# Patient Record
Sex: Female | Born: 1995 | Race: Black or African American | Hispanic: No | Marital: Single | Smoking: Current some day smoker
Health system: Southern US, Community
[De-identification: ages and names within clinical notes are randomized; demographics above are authoritative.]

## PROBLEM LIST (undated history)

## (undated) DIAGNOSIS — R112 Nausea with vomiting, unspecified: Secondary | ICD-10-CM

## (undated) MED ORDER — METRONIDAZOLE 500 MG TAB
500 mg | ORAL_TABLET | Freq: Two times a day (BID) | ORAL | Status: AC
Start: ? — End: 2013-11-10

---
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
## 2014-09-25 ENCOUNTER — Inpatient Hospital Stay
Admit: 2014-09-25 | Discharge: 2014-09-25 | Disposition: A | Discharge status: Home / Self Care | Payer: Self-pay | Attending: Emergency Medicine

## 2014-09-25 DIAGNOSIS — K2900 Acute gastritis without bleeding: Secondary | ICD-10-CM

## 2014-09-25 LAB — LIPASE: Lipase: 156 U/L (ref 73–393)

## 2014-09-25 LAB — METABOLIC PANEL, COMPREHENSIVE
A-G Ratio: 1.1 — ABNORMAL LOW (ref 1.2–3.5)
ALT (SGPT): 23 U/L (ref 12–65)
AST (SGOT): 21 U/L (ref 15–37)
Albumin: 4.3 g/dL (ref 3.5–5.0)
Alk. phosphatase: 64 U/L (ref 50–136)
Anion gap: 7 mmol/L (ref 7–16)
BUN: 12 MG/DL (ref 6–23)
Bilirubin, total: 0.3 MG/DL (ref 0.2–1.1)
CO2: 28 mmol/L (ref 21–32)
Calcium: 8.8 MG/DL (ref 8.3–10.4)
Chloride: 104 mmol/L (ref 98–107)
Creatinine: 0.67 MG/DL (ref 0.6–1.0)
GFR est AA: >60 mL/min/{1.73_m2} (ref >60)
GFR est non-AA: >60 mL/min/{1.73_m2} (ref >60)
Globulin: 3.9 g/dL — ABNORMAL HIGH (ref 2.3–3.5)
Glucose: 82 mg/dL (ref 65–100)
Potassium: 3.8 mmol/L (ref 3.5–5.1)
Protein, total: 8.2 g/dL (ref 6.3–8.2)
Sodium: 139 mmol/L (ref 136–145)

## 2014-09-25 LAB — CBC WITH AUTOMATED DIFF
ABS. BASOPHILS: 0 10*3/uL (ref 0.0–0.2)
ABS. EOSINOPHILS: 0.1 10*3/uL (ref 0.0–0.8)
ABS. IMM. GRANS.: 0 10*3/uL (ref 0.0–0.5)
ABS. LYMPHOCYTES: 2.6 10*3/uL (ref 0.5–4.6)
ABS. MONOCYTES: 0.4 10*3/uL (ref 0.1–1.3)
ABS. NEUTROPHILS: 4.7 10*3/uL (ref 1.7–8.2)
BASOPHILS: 0 % (ref 0.0–2.0)
EOSINOPHILS: 1 % (ref 0.5–7.8)
HCT: 40.3 % (ref 35.8–46.3)
HGB: 13.7 g/dL (ref 11.7–15.4)
IMMATURE GRANULOCYTES: 0.1 % (ref 0.0–5.0)
LYMPHOCYTES: 33 % (ref 13–44)
MCH: 30.6 PG (ref 26.1–32.9)
MCHC: 34 g/dL (ref 31.4–35.0)
MCV: 90 FL (ref 79.6–97.8)
MONOCYTES: 5 % (ref 4.0–12.0)
MPV: 11.9 FL (ref 10.8–14.1)
NEUTROPHILS: 61 % (ref 43–78)
PLATELET: 219 10*3/uL (ref 150–450)
RBC: 4.48 M/uL (ref 4.05–5.25)
RDW: 12.6 % (ref 11.9–14.6)
WBC: 7.8 10*3/uL (ref 4.5–13.5)

## 2014-09-25 LAB — HCG URINE, QL. - POC: Pregnancy test,urine (POC): NEGATIVE

## 2014-09-25 LAB — URINE MICROSCOPIC
Bacteria: 0 /hpf
Casts: 0 /lpf
RBC: >100 /hpf — ABNORMAL HIGH

## 2014-09-25 MED ORDER — GI COCKTAIL
Freq: Once | ORAL | Status: AC
Start: 2014-09-25 — End: 2014-09-25
  Administered 2014-09-25: 21:00:00 via ORAL

## 2014-09-25 MED ORDER — LIDOCAINE 2 % MUCOSAL SOLN
2 % | Freq: Once | Status: AC
Start: 2014-09-25 — End: 2014-09-25
  Administered 2014-09-25: 21:00:00 via OROMUCOSAL

## 2014-09-25 MED ORDER — ONDANSETRON (PF) 4 MG/2 ML INJECTION
4 mg/2 mL | INTRAMUSCULAR | Status: AC
Start: 2014-09-25 — End: 2014-09-25
  Administered 2014-09-25: 21:00:00 via INTRAVENOUS

## 2014-09-25 MED ORDER — FAMOTIDINE 40 MG TAB
40 mg | ORAL_TABLET | Freq: Every evening | ORAL | Status: AC
Start: 2014-09-25 — End: ?

## 2014-09-25 MED FILL — ONDANSETRON (PF) 4 MG/2 ML INJECTION: 4 mg/2 mL | INTRAMUSCULAR | Qty: 2

## 2014-09-25 MED FILL — LIDOCAINE 2 % MUCOSAL SOLN: 2 % | Qty: 15

## 2014-09-25 MED FILL — GI COCKTAIL: ORAL | Qty: 30

## 2014-09-25 NOTE — ED Provider Notes (Addendum)
HPI Comments: Patient reports upper abdominal pain for the past week.  She denies any change with food.  No ill contacts or travel.  She has had diarrhea for the same time with nausea.  No episodes of vomiting or fever.  She reports having a normal menstruation last week.  She developed vaginal bleeding again 2 days ago.  She is having to change her pad about every 2 hours.  She does admit to having history of irregular vaginal bleeding in the past.  Denies any pain with urination.    Patient is a 18 y.o. female presenting with abdominal pain and vaginal bleeding. The history is provided by the patient.   Abdominal Pain   This is a new problem. Episode onset: one week. The problem occurs constantly. The problem has not changed since onset.The pain is located in the epigastric region. The quality of the pain is aching. The pain is moderate. Associated symptoms include diarrhea and nausea. Pertinent negatives include no fever, no vomiting, no dysuria, no headaches, no trauma, no chest pain and no back pain. Nothing worsens the pain. The pain is relieved by nothing.   Vaginal Bleeding  This is a new problem. The current episode started 2 days ago. The problem occurs constantly. The problem has not changed since onset.Associated symptoms include abdominal pain. Pertinent negatives include no chest pain, no headaches and no shortness of breath. Nothing aggravates the symptoms. Nothing relieves the symptoms.        Past Medical History:   Diagnosis Date   ??? Asthma        History reviewed. No pertinent past surgical history.      History reviewed. No pertinent family history.    History     Social History   ??? Marital Status: SINGLE     Spouse Name: N/A     Number of Children: N/A   ??? Years of Education: N/A     Occupational History   ??? Not on file.     Social History Main Topics   ??? Smoking status: Never Smoker    ??? Smokeless tobacco: Not on file   ??? Alcohol Use: No   ??? Drug Use: No   ??? Sexual Activity: No      Other Topics Concern   ??? Not on file     Social History Narrative                ALLERGIES: Review of patient's allergies indicates no known allergies.      Review of Systems   Constitutional: Negative for fever and chills.   HENT: Negative for hearing loss.    Eyes: Negative for visual disturbance.   Respiratory: Negative for cough and shortness of breath.    Cardiovascular: Negative for chest pain and palpitations.   Gastrointestinal: Positive for nausea, abdominal pain and diarrhea. Negative for vomiting and blood in stool.   Genitourinary: Positive for vaginal bleeding. Negative for dysuria.   Musculoskeletal: Negative for back pain.   Skin: Negative for rash.   Neurological: Negative for weakness and headaches.   Psychiatric/Behavioral: Negative for confusion.       Filed Vitals:    09/25/14 1422   BP: 107/76   Pulse: 66   Temp: 98.4 ??F (36.9 ??C)   Resp: 16   Height: 5' 0.5" (1.537 m)   Weight: 65.772 kg (145 lb)   SpO2: 100%            Physical Exam   Constitutional: She  appears well-developed and well-nourished.   HENT:   Head: Normocephalic and atraumatic.   Right Ear: External ear normal.   Left Ear: External ear normal.   Nose: Nose normal.   Mouth/Throat: Oropharynx is clear and moist.   Eyes: Conjunctivae are normal. Pupils are equal, round, and reactive to light.   Neck: Normal range of motion. Neck supple.   Cardiovascular: Regular rhythm, normal heart sounds and intact distal pulses.    Pulmonary/Chest: Effort normal and breath sounds normal. No respiratory distress. She has no wheezes.   Abdominal: Soft. Bowel sounds are normal. She exhibits no distension. There is tenderness in the epigastric area. There is no rigidity, no rebound, no guarding, no tenderness at McBurney's point and negative Murphy's sign.       Musculoskeletal: Normal range of motion. She exhibits no edema.   Neurological: She is alert.   Skin: Skin is warm and dry.   Psychiatric: Judgment normal.    Nursing note and vitals reviewed.       MDM  Number of Diagnoses or Management Options     Amount and/or Complexity of Data Reviewed  Clinical lab tests: ordered and reviewed  Tests in the medicine section of CPT??: ordered and reviewed    Risk of Complications, Morbidity, and/or Mortality  Presenting problems: moderate  Diagnostic procedures: moderate  Management options: moderate    Patient Progress  Patient progress: stable      Procedures                                                    4:06 PM  Patient feeling much better.  Ready to go home.  Likely peptic ulcer disease or gastritis.  Patient seems to avoid any spicy foods and will try Pepcid.

## 2014-09-25 NOTE — ED Notes (Signed)
Patient presents with vaginal bleeding and lower abdomen pain.

## 2014-09-25 NOTE — ED Notes (Signed)
I have reviewed medications, follow up provider options, and discharge instructions with the patient and family. The patient verbalized understanding. Copy of discharge information given to patient upon discharge. Prescription(s) given to patient. Patient discharged in no distress.

## 2014-11-11 ENCOUNTER — Inpatient Hospital Stay
Admit: 2014-11-11 | Discharge: 2014-11-11 | Disposition: A | Discharge status: Home / Self Care | Payer: Self-pay | Attending: Emergency Medicine

## 2014-11-11 ENCOUNTER — Emergency Department: Admit: 2014-11-11 | Payer: Self-pay

## 2014-11-11 DIAGNOSIS — R51 Headache: Secondary | ICD-10-CM

## 2014-11-11 LAB — HCG URINE, QL. - POC: Pregnancy test,urine (POC): NEGATIVE

## 2014-11-11 MED ORDER — NAPROXEN 500 MG TAB
500 mg | ORAL_TABLET | Freq: Two times a day (BID) | ORAL | Status: AC
Start: 2014-11-11 — End: 2014-11-21

## 2014-11-11 MED ORDER — ACETAMINOPHEN 325 MG TABLET
325 mg | ORAL | Status: AC
Start: 2014-11-11 — End: 2014-11-11
  Administered 2014-11-11: 15:00:00 via ORAL

## 2014-11-11 MED FILL — TYLENOL 325 MG TABLET: 325 mg | ORAL | Qty: 2

## 2014-11-11 NOTE — Progress Notes (Signed)
Visited with patient as no PCP listed in chart.  Patient does not have a PCP - states she just signed up for insurance and will be effective with [employer] in March.  I have provided patient with a list of PCPs and offered to make her an appointment.  Encouraged to call now make appointment to get established for March if she chooses to do it herself.

## 2014-11-11 NOTE — ED Provider Notes (Signed)
HPI Comments: Pt states her biology book fell from shelf and hit her across the nose yesterday, headache since, feels like "fluid is in my head, something is wrong", no meds taken for pain, no nv    Patient is a 19 y.o. female presenting with facial pain. The history is provided by the patient.   Facial Pain   The incident occurred 12 to 24 hours ago. She came to the ER via walk-in. The injury mechanism was a direct blow. The volume of blood lost was none. The quality of the pain is described as dull. The pain is at a severity of 10/10. The pain is mild. The pain has been constant since the injury. Pertinent negatives include no numbness, no blurred vision, no vomiting, no tinnitus, no disorientation, no weakness and no memory loss. She has tried nothing for the symptoms. The treatment provided no relief. There was no loss of consciousness. She has been behaving normally.        Past Medical History:   Diagnosis Date   ??? Asthma    ??? Gastrointestinal disorder        Past Surgical History:   Procedure Laterality Date   ??? Hx heent       wisdom teeth          History reviewed. No pertinent family history.    History     Social History   ??? Marital Status: SINGLE     Spouse Name: N/A     Number of Children: N/A   ??? Years of Education: N/A     Occupational History   ??? Not on file.     Social History Main Topics   ??? Smoking status: Never Smoker    ??? Smokeless tobacco: Not on file   ??? Alcohol Use: No   ??? Drug Use: No   ??? Sexual Activity: No     Other Topics Concern   ??? Not on file     Social History Narrative           ALLERGIES: Review of patient's allergies indicates no known allergies.      Review of Systems   HENT: Negative for tinnitus.    Eyes: Negative for blurred vision.   Gastrointestinal: Negative for vomiting.   Neurological: Negative for weakness and numbness.   Psychiatric/Behavioral: Negative for memory loss.   All other systems reviewed and are negative.      Filed Vitals:    11/11/14 0908   BP: 103/63    Pulse: 88   Temp: 98.1 ??F (36.7 ??C)   Resp: 16   Height: 5' 0.5" (1.537 m)   Weight: 65.772 kg (145 lb)   SpO2: 99%            Physical Exam   Constitutional: She is oriented to person, place, and time. She appears well-developed and well-nourished. No distress.   HENT:   Head: Normocephalic.   Right Ear: External ear normal.   Left Ear: External ear normal.   Small abrasion to bridge of nose from glasses, no bleeding, no facial swelling   Eyes: Conjunctivae and EOM are normal. Pupils are equal, round, and reactive to light.   Neck: Normal range of motion. Neck supple.   Cardiovascular: Normal rate, regular rhythm and normal heart sounds.    Pulmonary/Chest: Effort normal and breath sounds normal. No respiratory distress. She has no wheezes.   Abdominal: Soft. Bowel sounds are normal. There is no tenderness. There is no rebound and  no guarding.   Musculoskeletal: Normal range of motion. She exhibits no edema.   Neurological: She is alert and oriented to person, place, and time. She has normal reflexes. No cranial nerve deficit. Coordination normal.   Skin: Skin is warm and dry.   Psychiatric: She has a normal mood and affect.   Nursing note and vitals reviewed.       MDM  Number of Diagnoses or Management Options  Diagnosis management comments: Head ct -  Pt given tylenol for pain in er  rx for naprosyn        Amount and/or Complexity of Data Reviewed  Clinical lab tests: ordered and reviewed  Tests in the radiology section of CPT??: ordered and reviewed    Risk of Complications, Morbidity, and/or Mortality  Presenting problems: low  Diagnostic procedures: low  Management options: low    Patient Progress  Patient progress: improved      Procedures

## 2014-11-11 NOTE — ED Notes (Signed)
I have reviewed medications, follow up provider options, and discharge instructions with the patient and her aunt. The patient verbalized understanding. Copy of discharge information given to patient upon discharge. Prescription(s) given to patient. Patient discharged in no distress.

## 2014-11-11 NOTE — ED Notes (Addendum)
Patient states she hit her face with a box of school books as she was taking it off a shelf approx 12 hours ago.  Patient denies it braking her eyeglasses. Patient denies loc.  Patient reports nasal congestion and frontal headache.

## 2014-12-21 ENCOUNTER — Encounter

## 2015-01-03 ENCOUNTER — Inpatient Hospital Stay: Admit: 2015-01-03 | Payer: BLUE CROSS/BLUE SHIELD | Attending: Emergency Medicine

## 2015-01-03 DIAGNOSIS — R109 Unspecified abdominal pain: Secondary | ICD-10-CM

## 2015-01-03 DIAGNOSIS — R112 Nausea with vomiting, unspecified: Secondary | ICD-10-CM

## 2015-01-03 MED ORDER — NUCLEAR MEDICINE ISOTOPE
Freq: Once | Status: AC
Start: 2015-01-03 — End: 2015-01-03
  Administered 2015-01-03: 15:00:00

## 2015-01-03 MED ORDER — SINCALIDE 5 MCG IJ SOLR
5 mcg | Freq: Once | INTRAMUSCULAR | Status: AC
Start: 2015-01-03 — End: 2015-01-03
  Administered 2015-01-03: 16:00:00 via INTRAVENOUS

## 2015-01-03 MED FILL — NUCLEAR MEDICINE ISOTOPE: Qty: 1

## 2015-03-18 ENCOUNTER — Ambulatory Visit: Admit: 2015-03-18 | Discharge: 2015-03-18 | Payer: BLUE CROSS/BLUE SHIELD

## 2015-03-18 DIAGNOSIS — R110 Nausea: Secondary | ICD-10-CM

## 2015-03-18 MED ORDER — DIPHENOXYLATE-ATROPINE 2.5 MG-0.025 MG TAB
ORAL_TABLET | Freq: Four times a day (QID) | ORAL | Status: AC | PRN
Start: 2015-03-18 — End: 2015-03-21

## 2015-03-18 MED ORDER — ONDANSETRON 8 MG TAB, RAPID DISSOLVE
8 mg | ORAL_TABLET | Freq: Three times a day (TID) | ORAL | Status: AC | PRN
Start: 2015-03-18 — End: ?

## 2015-03-18 NOTE — Patient Instructions (Signed)
Food Poisoning: Care Instructions  Your Care Instructions  Food poisoning occurs when you eat foods that contain harmful germs. Food can be contaminated while it is growing, during processing, or when it is prepared. Fresh fruits and vegetables also can be contaminated if they are washed in contaminated water. You may have become ill after eating undercooked meat or eggs or other unsafe foods. Cooking foods thoroughly and storing them properly can help prevent food poisoning.  There are many types of food poisoning. Your symptoms depend on the type of food poisoning you have. You will probably begin to feel better in 1 or 2 days. In the meantime, get plenty of rest and make sure that you do not become dehydrated.  Follow-up care is a key part of your treatment and safety. Be sure to make and go to all appointments, and call your doctor if you are having problems. It???s also a good idea to know your test results and keep a list of the medicines you take.  How can you care for yourself at home?  ?? To prevent dehydration, drink plenty of fluids, enough so that your urine is light yellow or clear like water. Choose water and other caffeine-free clear liquids until you feel better. If you have kidney, heart, or liver disease and have to limit fluids, talk with your doctor before you increase the amount of fluids you drink.  ?? Begin eating small amounts of mild, low-fat foods, depending on how you feel. Try foods like rice, dry crackers, bananas, and applesauce.  ?? Avoid spicy foods, alcohol, and coffee until 48 hours after all symptoms have disappeared.  ?? Avoid chewing gum that contains sorbitol.  ?? Avoid dairy products for 3 days after symptoms disappear.  ?? Take your medicines as prescribed. Call your doctor if you think you are having a problem with your medicine. You will get more details on the specific medicines your doctor prescribes.  To prevent food poisoning  ?? Keep hot foods hot and cold foods cold.   ?? Do not eat meats, dressings, salads, or other foods that have been kept at room temperature for more than 2 hours.  ?? Use a thermometer to check your refrigerator. It should be between 34??F and 40??F.  ?? Defrost meats in the refrigerator or microwave, not on the kitchen counter.  ?? Keep your hands and your kitchen clean. Wash your hands, cutting boards, and countertops with hot, soapy water. If you use the same cutting board for chopping vegetables and preparing raw meat, be sure to wash the cutting board with hot, soapy water between each use.  ?? Cook meat until it is well done.  ?? Do not eat raw eggs or uncooked dough or sauces made with raw eggs.  ?? Do not take chances. If you think food looks or tastes spoiled, throw it out.  When should you call for help?  Call 911 anytime you think you may need emergency care. For example, call if:  ?? You have sudden, severe belly pain.  ?? You passed out (lost consciousness).  ?? You vomit blood or what looks like coffee grounds.  ?? You pass maroon or very bloody stools.  Call your doctor now or seek immediate medical care if:  ?? You have signs of needing more fluids. You have sunken eyes and a dry mouth, and you pass only a little dark urine.  ?? Weakness in your legs makes it hard to stand or walk.  ?? You   have swelling in your hands, face, or feet.  ?? You have trouble breathing.  ?? You are dizzy or lightheaded, or you feel like you may faint.  ?? Your stools are black and tarlike or have streaks of blood.  ?? You have numbness and tingling in your hands or feet.  ?? You have new nausea or vomiting.  ?? You have new or increased belly pain.  ?? Your joints ache.  Watch closely for changes in your health, and be sure to contact your doctor if:  ?? Your vomiting is not getting better after 1 to 2 days.  ?? Your diarrhea is not getting better after 3 days.  Where can you learn more?  Go to [employer]http://www.healthwise.net/[employer]   Enter C880 in the search box to learn more about "Food Poisoning: Care Instructions."  ?? 2006-2016 [employer]. Care instructions adapted under license by [employer] (which disclaims liability or warranty for this information). This care instruction is for use with your licensed healthcare professional. If you have questions about a medical condition or this instruction, always ask your healthcare professional. [employer] disclaims any warranty or liability for your use of this information.  Content Version: 10.9.538570; Current as of: Mar 05, 2014        Gastroenteritis: Care Instructions  Your Care Instructions  Gastroenteritis is an illness that may cause nausea, vomiting, and diarrhea. It is sometimes called "stomach flu." It can be caused by bacteria or a virus.  You will probably begin to feel better in 1 to 2 days. In the meantime, get plenty of rest and make sure you do not become dehydrated. Dehydration occurs when your body loses too much fluid.  Follow-up care is a key part of your treatment and safety. Be sure to make and go to all appointments, and call your doctor if you are having problems. It???s also a good idea to know your test results and keep a list of the medicines you take.  How can you care for yourself at home?  ?? If your doctor prescribed antibiotics, take them as directed. Do not stop taking them just because you feel better. You need to take the full course of antibiotics.  ?? Drink plenty of fluids to prevent dehydration, enough so that your urine is light yellow or clear like water. Choose water and other caffeine-free clear liquids until you feel better. If you have kidney, heart, or liver disease and have to limit fluids, talk with your doctor before you increase your fluid intake.  ?? Drink fluids slowly, in frequent, small amounts, because drinking too much too fast can cause vomiting.   ?? Begin eating mild foods, such as dry toast, yogurt, applesauce, bananas, and rice. Avoid spicy, hot, or high-fat foods, and do not drink alcohol or caffeine for a day or two. Do not drink milk or eat ice cream until you are feeling better.  How to prevent gastroenteritis  ?? Keep hot foods hot and cold foods cold.  ?? Do not eat meats, dressings, salads, or other foods that have been kept at room temperature for more than 2 hours.  ?? Use a thermometer to check your refrigerator. It should be between 34??F and 40??F.  ?? Defrost meats in the refrigerator or microwave, not on the kitchen counter.  ?? Keep your hands and your kitchen clean. Wash your hands, cutting boards, and countertops with hot soapy water frequently.  ?? Cook meat until it is well done.  ?? Do  not eat raw eggs or uncooked sauces made with raw eggs.  ?? Do not take chances. If food looks or tastes spoiled, throw it out.  When should you call for help?  Call 911 anytime you think you may need emergency care. For example, call if:  ?? You vomit blood or what looks like coffee grounds.  ?? You passed out (lost consciousness).  ?? You pass maroon or very bloody stools.  Call your doctor now or seek immediate medical care if:  ?? You have severe belly pain.  ?? You have signs of needing more fluids. You have sunken eyes, a dry mouth, and pass only a little dark urine.  ?? You feel like you are going to faint.  ?? You have increased belly pain that does not go away in 1 to 2 days.  ?? You have new or increased nausea, or you are vomiting.  ?? You have a new or higher fever.  ?? Your stools are black and tarlike or have streaks of blood.  Watch closely for changes in your health, and be sure to contact your doctor if:  ?? You are dizzy or lightheaded.  ?? You urinate less than usual, or your urine is dark yellow or brown.  ?? You do not feel better with each day that goes by.  Where can you learn more?  Go to [employer]cahttp://www.healthwise.net/[employer]HelpConnections   Enter N142 in the search box to learn more about "Gastroenteritis: Care Instructions."  ?? 2006-2016 [employer]. Care instructions adapted under license by [employer] (which disclaims liability or warranty for this information). This care instruction is for use with your licensed healthcare professional. If you have questions about a medical condition or this instruction, always ask your healthcare professional. [employer] disclaims any warranty or liability for your use of this information.  Content Version: 10.9.538570; Current as of: Mar 05, 2014

## 2015-03-18 NOTE — Progress Notes (Addendum)
HISTORY OF PRESENT ILLNESS  Narmeen Kerper is a 19 y.o. female. Hx of gastritis.   HPI Comments: Patient reports here today with abdominal cramps, pain and upset stomach.  She states that she had received some "tuna on [place]" and did not eat it until Tuesday.  She states that since she ate the tuna she has felt bad.    States that S&S are improving since Tuesday. Hx of "stomach issues". Requests work note for today and tomorrow. Pain is relieved with defecation. Denies chest pain, SOB, abd pain, pelvic pain, urinary S&S, n/v/d/fever. Denies any syncopal episodes or LOC. Denies blood stools or mucus in stools.    Abdominal Pain  The history is provided by the patient. This is a new problem. The current episode started yesterday. The problem occurs constantly. The problem has not changed since onset.Associated symptoms include abdominal pain. Nothing aggravates the symptoms. Nothing relieves the symptoms. She has tried nothing for the symptoms. The treatment provided no relief.       Review of Systems   Constitutional: Negative.    HENT: Negative.    Eyes: Negative.    Respiratory: Negative.    Cardiovascular: Negative.    Gastrointestinal: Positive for nausea, abdominal pain and diarrhea.   Genitourinary: Negative.    Musculoskeletal: Negative.    Skin: Negative.    Neurological: Negative.    Endo/Heme/Allergies: Negative.    Psychiatric/Behavioral: Negative.        Physical Exam   Constitutional: She is oriented to person, place, and time. She appears well-developed and well-nourished. No distress.   HENT:   Head: Normocephalic and atraumatic.   Eyes: Pupils are equal, round, and reactive to light. Right eye exhibits no discharge. Left eye exhibits no discharge. No scleral icterus.   Neck: Normal range of motion.   Cardiovascular: Normal rate, regular rhythm, S1 normal, S2 normal, normal heart sounds, intact distal pulses and normal pulses.   No extrasystoles  are present. PMI is not displaced.  Exam reveals no gallop, no S3, no S4, no distant heart sounds and no friction rub.    No murmur heard.  Pulmonary/Chest: Effort normal and breath sounds normal. No accessory muscle usage. No respiratory distress. She has no decreased breath sounds. She has no wheezes. She has no rhonchi. She has no rales. She exhibits no tenderness.   Abdominal: Soft. Normal appearance. She exhibits no shifting dullness, no distension, no pulsatile liver, no fluid wave, no abdominal bruit, no ascites, no pulsatile midline mass and no mass. Bowel sounds are increased. There is no hepatosplenomegaly, splenomegaly or hepatomegaly. There is no tenderness. There is no rigidity, no rebound, no guarding, no CVA tenderness, no tenderness at McBurney's point and negative Murphy's sign.   Musculoskeletal: Normal range of motion.   Lymphadenopathy:     She has no cervical adenopathy.   Neurological: She is alert and oriented to person, place, and time.   Skin: Skin is warm and dry. No rash noted. She is not diaphoretic. No erythema. No pallor.   Psychiatric: She has a normal mood and affect. Her speech is normal and behavior is normal.   Nursing note and vitals reviewed.      ASSESSMENT and PLAN  Declines blood work and other diagnostic testing today including urine dipstick and pregnancy test.  Patient to f/u by Monday if no improvement or if S&S worsen.  Given Zofran for intermittent nausea.  Given Lomotil for diarrheal S&S.  Patient aware of S&S that warrant immediate attention  and emergent care. Verbalized understanding.    GASTROENTERITIS/Food Poisoning    1. See above  2. Patient education includes optimal management with oral rehydration therapy for mild and moderate cases and IV fluids for severe cases, plus adequate nutrition. Routine use of antibiotics, antidiarrheal agents, and antiemetics is not recommended and may cause harm.  3. Prevention through good hygiene is the key in controlling viral  gastroenteritis. In addition, rotavirus vaccines have recently been approved for infants.  4. For mild to moderate volume depletion and is able to tolerate oral fluids: ORT has 2 phases: 1) a rehydration phase, to replace existing losses, and 2) a maintenance phase, which includes both replacement of ongoing fluid and electrolyte losses and adequate dietary intake. There is no standard formula for adults, but about 1 L/hour is required in most cases with ongoing losses.  5. Patients should be encouraged to drink plenty of fluids and take salt in soups and salted crackers. Oral rehydration solution is recommended for patients who are older or immunocompromised, and anyone with profuse watery diarrhea. Regardless of fluid used, age-appropriate diet should also be given. Foods high in simple sugars, juice, and highly sugared liquids should be avoided as the osmotic load might worsen diarrhea; carbonated soft drinks should also be avoided.  6. Antidiarrheal agents may be used for symptomatic relief of watery diarrhea.  7. Primary prevention includes: Licensed vaccines include RotaTeq and Rotarix. Their safety and efficacy have been confirmed in large-scale trials. RotaTeq is a 3-dose live human-bovine pentavalent reassortant vaccine. Rotarix is a 2-dose attenuated (strain G1P) monovalent vaccine. Both vaccines are highly immunogenic; they provide cross-protection against common serotypes and decrease the rate of severe gastroenteritis. Neither is associated with an increased risk of intussusception, which was seen with the first licensed vaccine, Rotashield, since withdrawn from the market. However, the FDA recently issued an advisory reporting 28 cases of intussusception following administration of the RotaTeq vaccine. The CDC recommends vaccination of all infants to prevent severe rotavirus gastroenteritis. [15] Vaccination is not recommended for adults.   8. Recommendations for secondary prevention include: Outbreaks should be reported to the [facility] and local health authorities promptly. Routine administration of rotavirus vaccine is recommended for qualifying infants. Person-to-person spread is best minimized with frequent hand washing. Alcohol-based disinfectants have been shown to reduce work absences due to diarrhea. Prompt disinfection of contaminated surfaces with household chlorine bleach-based cleaners, and prompt washing of soiled articles of clothing, is advised. 0.1% hypochlorite solution is an alternative disinfection agent. It may be prudent to isolate or cohort patients with suspected norovirus. Facemasks should be worn if splashes are possible, such as with incontinent patients. Discharge of an affected patient from an acute care facility to a nursing home should only be considered when 5 days have elapsed since cessation of symptoms. For patients returning home, a 2-day clearance period is sufficient.  9. Patient Instructions: Important considerations include (1) Frequent hand washing is important to reduce spread. (2) Prompt disinfection of contaminated surfaces with household chlorine bleach-based cleaners and washing of soiled clothing and bedding should be advised. (3) If food or water is thought to be contaminated, it should be avoided.  10. P.I. Diet and fluids include (1) Drinks high in sugars such as carbonated soft drinks, fruit juice, and highly sugared liquids should be avoided. (2) Patients also need to be warned about possible complications such as lactose intolerance and protein intolerance after an episode of acute gastroenteritis.    Orders Placed This Encounter   ???  diphenoxylate-atropine (LOMOTIL) 2.5-0.025 mg per tablet     Sig: Take 1 Tab by mouth four (4) times daily as needed for Diarrhea for up to 3 days. Max Daily Amount: 4 Tabs.     Dispense:  12 Tab     Refill:  0   ??? ondansetron (ZOFRAN ODT) 8 mg disintegrating tablet      Sig: Take 1 Tab by mouth every eight (8) hours as needed for Nausea.     Dispense:  9 Tab     Refill:  0         Current outpatient prescriptions:   ???  diphenoxylate-atropine (LOMOTIL) 2.5-0.025 mg per tablet, Take 1 Tab by mouth four (4) times daily as needed for Diarrhea for up to 3 days. Max Daily Amount: 4 Tabs., Disp: 12 Tab, Rfl: 0  ???  ondansetron (ZOFRAN ODT) 8 mg disintegrating tablet, Take 1 Tab by mouth every eight (8) hours as needed for Nausea., Disp: 9 Tab, Rfl: 0  ???  famotidine (PEPCID) 40 mg tablet, Take 1 Tab by mouth nightly., Disp: 30 Tab, Rfl: 0    Visit Vitals   Item Reading   ??? BP 100/88 mmHg   ??? Pulse 81   ??? Temp(Src) 99.2 ??F (37.3 ??C) (Oral)   ??? Resp 18   ??? Ht 5' 0.5" (1.537 m)   ??? Wt 154 lb (69.854 kg)   ??? BMI 29.57 kg/m2   ??? SpO2 98%       Patient aware of all medications (prescribed and recommended). Patient verbalized understanding. Patient declined all other medications (OTC, provided by clinic and prescribed) as well as additional testing/imaging/diagnostics at this time. Patient verbalized understanding. Patient aware of risks associated with declining treatment/recommendations and/or non-compliance with plan of care.    *Side effects, adverse effects, risks versus benefits associated with medications prescribed/recommended were discussed with the patient. Patient verbalized understanding. All questions answered.    *Patient was encouraged to return to the clinic and/or PCP. Or seek emergent care if worsening signs and symptoms warrant immediate evaluation including, but not limited to HA, blurred vision, facial asymmetry, speech disturbance, difficulty with ambulation/gait, numbness, tingling, weakness, syncope, chest pain (with or without radiation), left arm pain, jaw pain, changes in hearing (loss), fever, unexplained sweating, malaise/fatigue, difficulty swallowing, mental changes (confusion, AMS), lightheadedness/dizziness, difficulty breathing, or shortness of breath.      I have reviewed the patient's medication list, past medical, family, social, and surgical history in detail and updated the patient record appropriately.    I have reviewed the patient's vital signs and discussed risks associated with any abnormal vital signs (during visit and following this visit) as well as appropriate parameters with the patient. Patient verbalized understanding. Patient agreed to seek emergent care if vital signs are above or below the parameters discussed.      ICD-10-CM ICD-9-CM    1. Nausea R11.0 787.02 ondansetron (ZOFRAN ODT) 8 mg disintegrating tablet   2. Diarrhea R19.7 787.91 diphenoxylate-atropine (LOMOTIL) 2.5-0.025 mg per tablet   3. Generalized abdominal pain R10.84 789.07          Past Medical History   Diagnosis Date   ??? Asthma    ??? Gastrointestinal disorder        Past Surgical History   Procedure Laterality Date   ??? Hx heent       wisdom teeth        History     Social History   ??? Marital Status: SINGLE  Spouse Name: N/A   ??? Number of Children: N/A   ??? Years of Education: N/A     Occupational History   ??? Not on file.     Social History Main Topics   ??? Smoking status: Never Smoker    ??? Smokeless tobacco: Not on file   ??? Alcohol Use: No   ??? Drug Use: No   ??? Sexual Activity: No     Other Topics Concern   ??? Not on file     Social History Narrative       No family history on file.    History   Smoking status   ??? Never Smoker    Smokeless tobacco   ??? Not on file       OB History   No data available

## 2015-07-19 MED ORDER — METRONIDAZOLE 250 MG TAB
250 mg | ORAL_TABLET | Freq: Two times a day (BID) | ORAL | 0 refills | Status: AC
Start: 2015-07-19 — End: ?

## 2015-07-19 NOTE — Telephone Encounter (Signed)
Patient called requesting rx for bacterial infection. Patient states she wants the pill not the gel. Sent to [facility] on [address] [place]

## 2018-12-02 ENCOUNTER — Encounter (HOSPITAL_COMMUNITY): Payer: Self-pay

## 2018-12-02 ENCOUNTER — Other Ambulatory Visit: Payer: Self-pay

## 2018-12-02 ENCOUNTER — Emergency Department (HOSPITAL_COMMUNITY)
Admission: EM | Admit: 2018-12-02 | Discharge: 2018-12-02 | Disposition: A | Discharge status: Home / Self Care | Payer: BLUE CROSS/BLUE SHIELD | Attending: Emergency Medicine | Admitting: Emergency Medicine

## 2018-12-02 ENCOUNTER — Emergency Department (HOSPITAL_COMMUNITY): Payer: BLUE CROSS/BLUE SHIELD

## 2018-12-02 DIAGNOSIS — R079 Chest pain, unspecified: Secondary | ICD-10-CM | POA: Insufficient documentation

## 2018-12-02 DIAGNOSIS — Z5321 Procedure and treatment not carried out due to patient leaving prior to being seen by health care provider: Secondary | ICD-10-CM | POA: Insufficient documentation

## 2018-12-02 LAB — BASIC METABOLIC PANEL
Anion gap: 8 (ref 5–15)
BUN: 8 mg/dL (ref 6–20)
CHLORIDE: 107 mmol/L (ref 98–111)
CO2: 24 mmol/L (ref 22–32)
Calcium: 9.4 mg/dL (ref 8.9–10.3)
Creatinine, Ser: 0.73 mg/dL (ref 0.44–1.00)
GFR calc Af Amer: >60 mL/min (ref >60)
GFR calc non Af Amer: >60 mL/min (ref >60)
Glucose, Bld: 121 mg/dL — ABNORMAL HIGH (ref 70–99)
Potassium: 3.8 mmol/L (ref 3.5–5.1)
Sodium: 139 mmol/L (ref 135–145)

## 2018-12-02 LAB — I-STAT TROPONIN, ED: Troponin i, poc: 0 ng/mL (ref 0.00–0.08)

## 2018-12-02 LAB — CBC
HCT: 40.6 % (ref 36.0–46.0)
Hemoglobin: 13.8 g/dL (ref 12.0–15.0)
MCH: 29.9 pg (ref 26.0–34.0)
MCHC: 34 g/dL (ref 30.0–36.0)
MCV: 88.1 fL (ref 80.0–100.0)
Platelets: 260 10*3/uL (ref 150–400)
RBC: 4.61 MIL/uL (ref 3.87–5.11)
RDW: 12.2 % (ref 11.5–15.5)
WBC: 8.1 10*3/uL (ref 4.0–10.5)
nRBC: 0 % (ref 0.0–0.2)

## 2018-12-02 LAB — I-STAT BETA HCG BLOOD, ED (MC, WL, AP ONLY): I-stat hCG, quantitative: <5 m[IU]/mL (ref <5)

## 2018-12-02 MED ORDER — SODIUM CHLORIDE 0.9% FLUSH: 3.0000 mL | Freq: Once | INTRAVENOUS | Status: DC

## 2018-12-02 NOTE — ED Notes (Signed)
Pt states her chest tightness has returned.

## 2018-12-02 NOTE — ED Triage Notes (Signed)
Pt here for chest pain and shortness of breath for the last month.  Pt A&Ox4 no heart of lung hx.

## 2018-12-02 NOTE — ED Notes (Signed)
Pt stated that she was leaving and going to another ER. She could not wait any longer.

## 2020-02-03 IMAGING — CR DG CHEST 2V
2 series · 2 of 2 positions shown · non-contrast
Comparison: None.

CLINICAL DATA: Chest pain short of breath

EXAM:
CHEST - 2 VIEW

[chest pa]
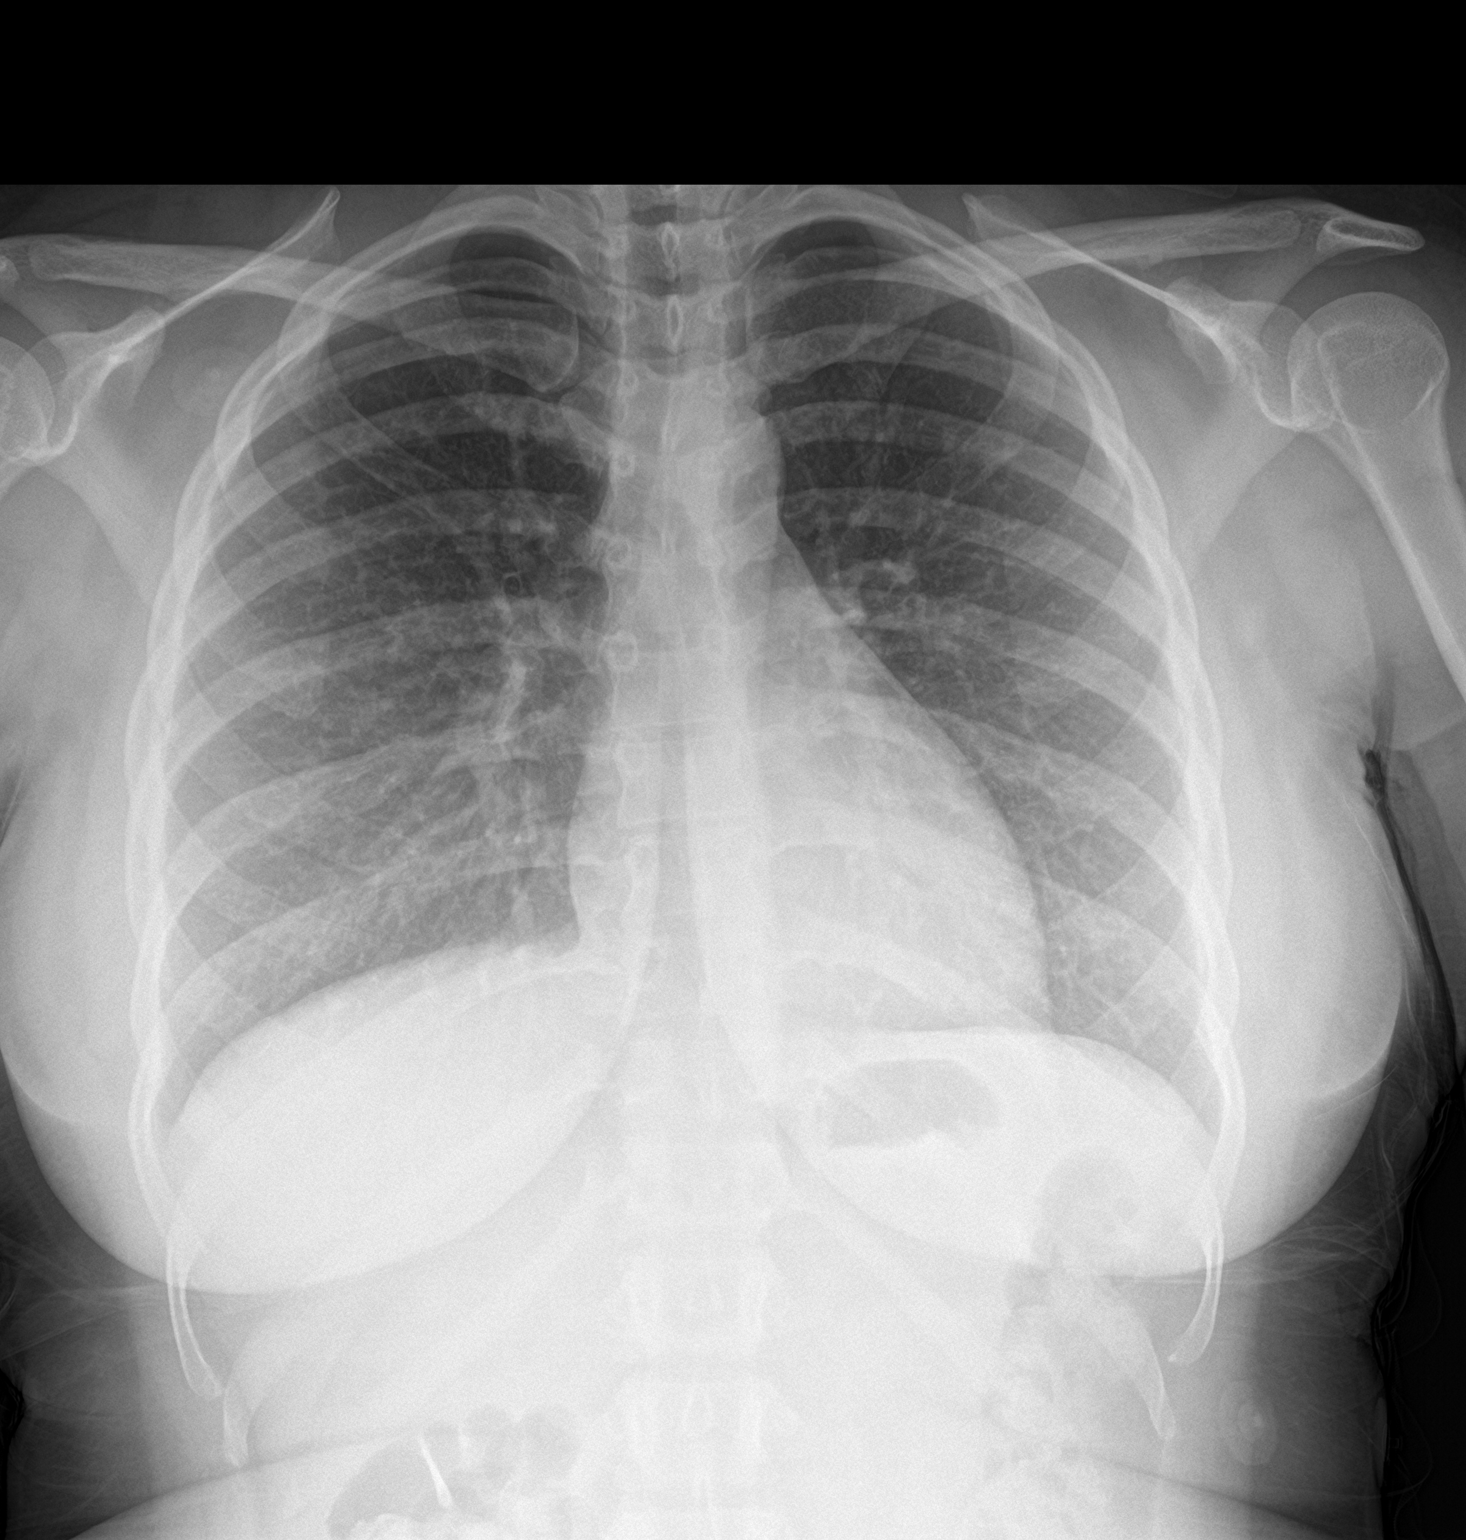

[chest lat]
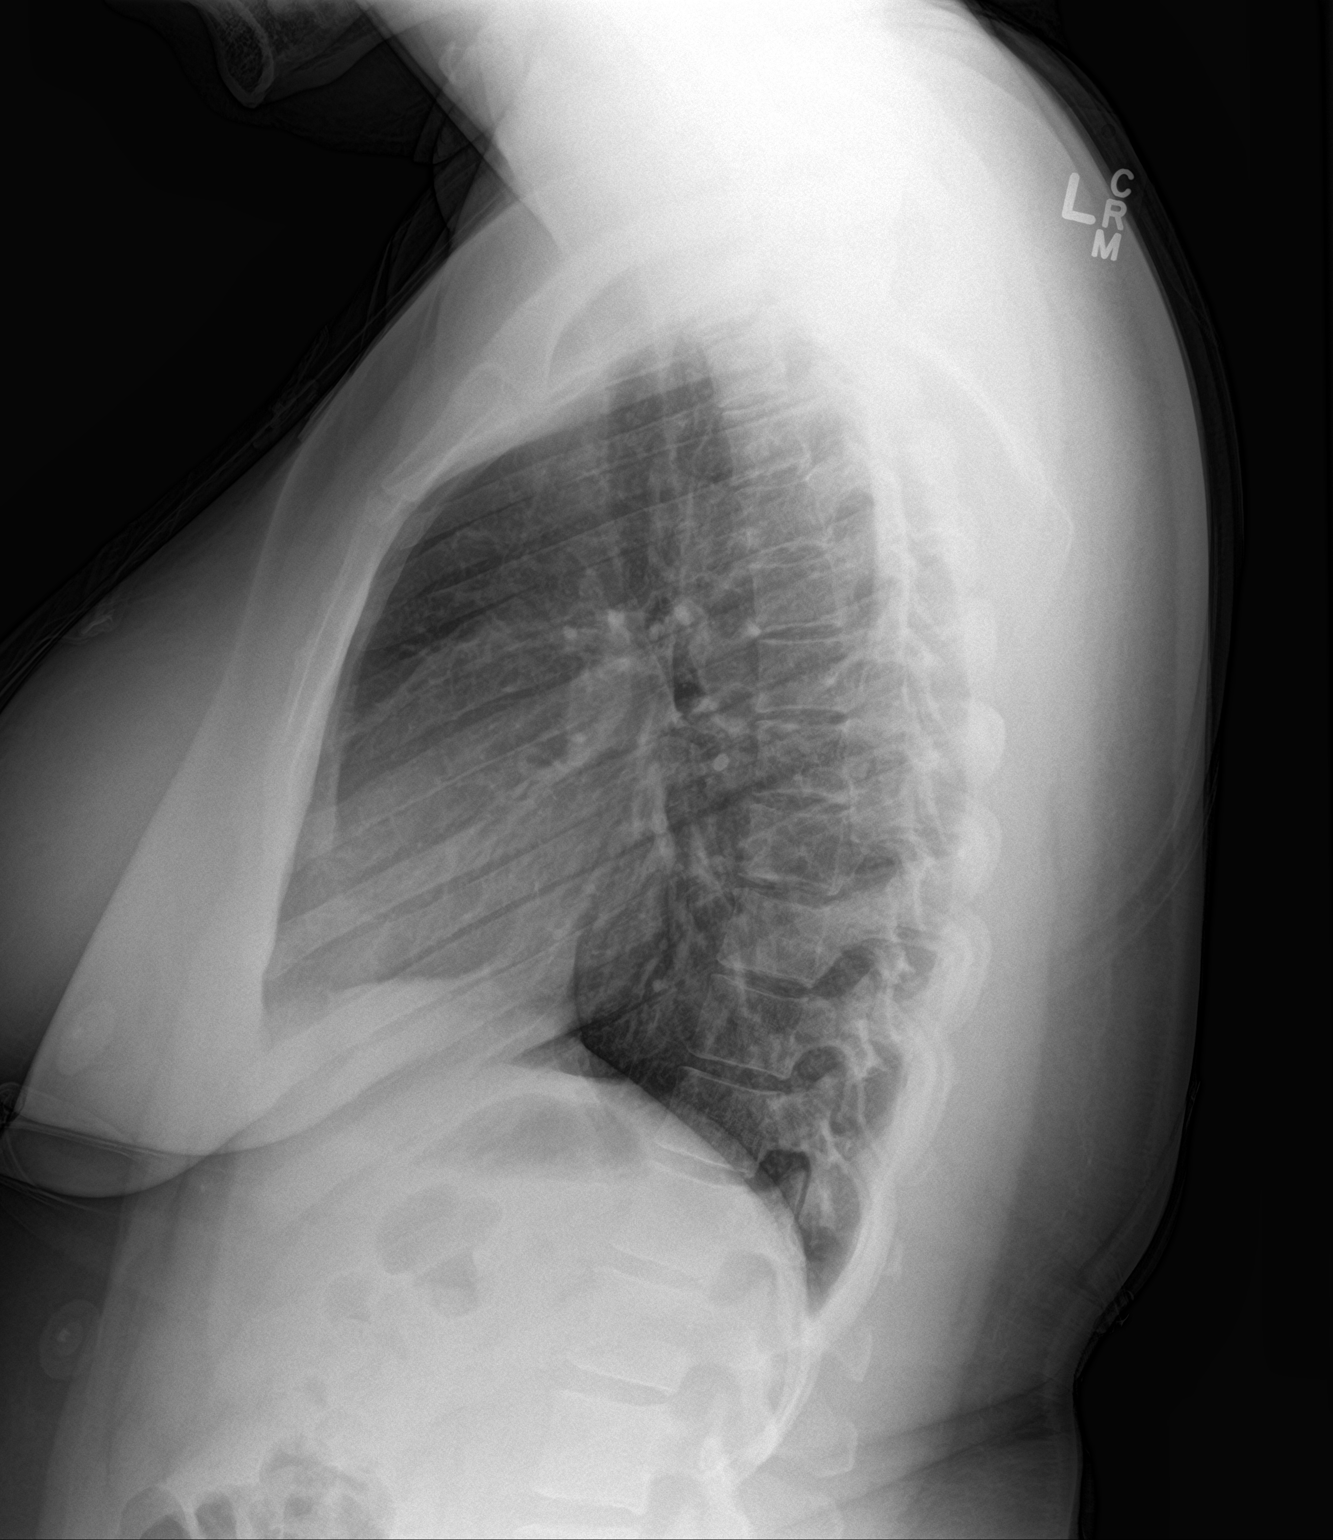

[2 of 2 positions shown; findings below may reference images not displayed]

FINDINGS: The heart size and mediastinal contours are within normal limits.
Both lungs are clear. The visualized skeletal structures are
unremarkable.
IMPRESSION: No active cardiopulmonary disease.
